# Patient Record
Sex: Female | Born: 2005 | Race: White | Hispanic: Yes | Marital: Single | Smoking: Never smoker
Health system: Southern US, Community
[De-identification: ages and names within clinical notes are randomized; demographics above are authoritative.]

## PROBLEM LIST (undated history)

## (undated) HISTORY — PX: NO PAST SURGERIES: SHX2092

---
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
## 2023-02-18 DIAGNOSIS — H5213 Myopia, bilateral: Secondary | ICD-10-CM | POA: Diagnosis not present

## 2023-03-13 DIAGNOSIS — H5213 Myopia, bilateral: Secondary | ICD-10-CM | POA: Diagnosis not present

## 2024-11-01 ENCOUNTER — Ambulatory Visit

## 2024-11-03 ENCOUNTER — Other Ambulatory Visit

## 2024-11-03 ENCOUNTER — Encounter: Payer: Self-pay | Admitting: Family

## 2024-11-03 ENCOUNTER — Ambulatory Visit: Admitting: Family

## 2024-11-03 ENCOUNTER — Other Ambulatory Visit (HOSPITAL_COMMUNITY)
Admission: RE | Admit: 2024-11-03 | Discharge: 2024-11-03 | Disposition: A | Source: Ambulatory Visit | Attending: Family | Admitting: Family

## 2024-11-03 VITALS — BP 138/87 | HR 78 | Ht 64.96 in | Wt 253.0 lb

## 2024-11-03 DIAGNOSIS — Z113 Encounter for screening for infections with a predominantly sexual mode of transmission: Secondary | ICD-10-CM

## 2024-11-03 DIAGNOSIS — E66813 Obesity, class 3: Secondary | ICD-10-CM

## 2024-11-03 DIAGNOSIS — L680 Hirsutism: Secondary | ICD-10-CM

## 2024-11-03 DIAGNOSIS — E559 Vitamin D deficiency, unspecified: Secondary | ICD-10-CM | POA: Diagnosis not present

## 2024-11-03 DIAGNOSIS — Z3202 Encounter for pregnancy test, result negative: Secondary | ICD-10-CM

## 2024-11-03 DIAGNOSIS — Z6841 Body Mass Index (BMI) 40.0 and over, adult: Secondary | ICD-10-CM | POA: Diagnosis not present

## 2024-11-03 DIAGNOSIS — N926 Irregular menstruation, unspecified: Secondary | ICD-10-CM | POA: Diagnosis not present
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
## 2024-11-03 NOTE — Patient Instructions (Addendum)
 It was nice to see you today! We will have a video visit in 3 weeks to review blood work results and discuss next steps.

## 2024-11-04 ENCOUNTER — Encounter: Payer: Self-pay | Admitting: Family

## 2024-11-07 ENCOUNTER — Ambulatory Visit: Payer: Self-pay | Admitting: Family

## 2024-11-07 LAB — URINE CYTOLOGY ANCILLARY ONLY
Chlamydia: NEGATIVE
Comment: NEGATIVE
Comment: NEGATIVE
Comment: NORMAL
Neisseria Gonorrhea: NEGATIVE
Trichomonas: NEGATIVE

## 2024-11-09 LAB — COMPREHENSIVE METABOLIC PANEL WITH GFR
AG Ratio: 1.8 (calc) (ref 1.0–2.5)
ALT: 29 U/L (ref 6–29)
AST: 17 U/L (ref 10–30)
Albumin: 4.9 g/dL (ref 3.6–5.1)
Alkaline phosphatase (APISO): 75 U/L (ref 31–125)
BUN: 12 mg/dL (ref 7–25)
CO2: 28 mmol/L (ref 20–32)
Calcium: 9.6 mg/dL (ref 8.6–10.2)
Chloride: 102 mmol/L (ref 98–110)
Creat: 0.61 mg/dL (ref 0.50–0.96)
Globulin: 2.8 g/dL (ref 1.9–3.7)
Glucose, Bld: 91 mg/dL (ref 65–99)
Potassium: 4.3 mmol/L (ref 3.5–5.3)
Sodium: 138 mmol/L (ref 135–146)
Total Bilirubin: 0.4 mg/dL (ref 0.2–1.2)
Total Protein: 7.7 g/dL (ref 6.1–8.1)
eGFR: 131 mL/min/1.73m2

## 2024-11-09 LAB — CBC WITH DIFFERENTIAL/PLATELET
Absolute Lymphocytes: 2849 {cells}/uL (ref 850–3900)
Absolute Monocytes: 716 {cells}/uL (ref 200–950)
Basophils Absolute: 54 {cells}/uL (ref 0–200)
Basophils Relative: 0.4 %
Eosinophils Absolute: 513 {cells}/uL — ABNORMAL HIGH (ref 15–500)
Eosinophils Relative: 3.8 %
HCT: 44.7 % (ref 35.9–46.0)
Hemoglobin: 14.8 g/dL (ref 11.7–15.5)
MCH: 28.7 pg (ref 27.0–33.0)
MCHC: 33.1 g/dL (ref 31.6–35.4)
MCV: 86.6 fL (ref 81.4–101.7)
MPV: 10.1 fL (ref 7.5–12.5)
Monocytes Relative: 5.3 %
Neutro Abs: 9369 {cells}/uL — ABNORMAL HIGH (ref 1500–7800)
Neutrophils Relative %: 69.4 %
Platelets: 435 Thousand/uL — ABNORMAL HIGH (ref 140–400)
RBC: 5.16 Million/uL — ABNORMAL HIGH (ref 3.80–5.10)
RDW: 12.2 % (ref 11.0–15.0)
Total Lymphocyte: 21.1 %
WBC: 13.5 Thousand/uL — ABNORMAL HIGH (ref 3.8–10.8)

## 2024-11-09 LAB — LIPID PANEL
Cholesterol: 181 mg/dL
HDL: 44 mg/dL — ABNORMAL LOW
LDL Cholesterol (Calc): 105 mg/dL — ABNORMAL HIGH
Non-HDL Cholesterol (Calc): 137 mg/dL — ABNORMAL HIGH
Total CHOL/HDL Ratio: 4.1 (calc)
Triglycerides: 204 mg/dL — ABNORMAL HIGH

## 2024-11-09 LAB — HEMOGLOBIN A1C
Hgb A1c MFr Bld: 5.4 %
Mean Plasma Glucose: 108 mg/dL
eAG (mmol/L): 6 mmol/L

## 2024-11-09 LAB — TESTOS,TOTAL,FREE AND SHBG (FEMALE)
Free Testosterone: 9.3 pg/mL — ABNORMAL HIGH (ref 0.1–6.4)
Sex Hormone Binding: 10 nmol/L — ABNORMAL LOW (ref 17–124)
Testosterone, Total, LC-MS-MS: 39 ng/dL (ref 2–45)

## 2024-11-09 LAB — FOLLICLE STIMULATING HORMONE: FSH: 6 m[IU]/mL

## 2024-11-09 LAB — VITAMIN D 25 HYDROXY (VIT D DEFICIENCY, FRACTURES): Vit D, 25-Hydroxy: 29 ng/mL — ABNORMAL LOW (ref 30–100)

## 2024-11-09 LAB — LUTEINIZING HORMONE: LH: 13.3 m[IU]/mL

## 2024-11-09 LAB — TSH+FREE T4: TSH W/REFLEX TO FT4: 1.34 m[IU]/L

## 2024-11-09 LAB — PROLACTIN: Prolactin: 7.8 ng/mL

## 2024-11-09 LAB — DHEA-SULFATE: DHEA-SO4: 204 ug/dL (ref 44–286)

## 2024-11-18 ENCOUNTER — Ambulatory Visit: Admitting: Pediatrics

## 2024-11-18 ENCOUNTER — Encounter: Payer: Self-pay | Admitting: Pediatrics

## 2024-11-18 ENCOUNTER — Ambulatory Visit
Admission: RE | Admit: 2024-11-18 | Discharge: 2024-11-18 | Disposition: A | Source: Ambulatory Visit | Attending: Pediatrics

## 2024-11-18 VITALS — HR 121 | Temp 99.4°F | Wt 255.2 lb

## 2024-11-18 DIAGNOSIS — J9801 Acute bronchospasm: Secondary | ICD-10-CM

## 2024-11-18 DIAGNOSIS — B349 Viral infection, unspecified: Secondary | ICD-10-CM

## 2024-11-18 DIAGNOSIS — R0603 Acute respiratory distress: Secondary | ICD-10-CM

## 2024-11-18 MED ORDER — ALBUTEROL SULFATE HFA 108 (90 BASE) MCG/ACT IN AERS
4.0000 | INHALATION_SPRAY | Freq: Once | RESPIRATORY_TRACT | Status: AC
  Administered 2024-11-18: 4 via RESPIRATORY_TRACT

## 2024-11-18 MED ORDER — ALBUTEROL SULFATE HFA 108 (90 BASE) MCG/ACT IN AERS: 2.0000 | INHALATION_SPRAY | RESPIRATORY_TRACT | 0 refills | Status: AC | PRN

## 2024-11-18 MED ORDER — DEXAMETHASONE 10 MG/ML FOR PEDIATRIC ORAL USE
16.0000 mg | Freq: Once | INTRAMUSCULAR | Status: AC
  Administered 2024-11-18: 16 mg via ORAL

## 2024-11-18 MED ORDER — PREDNISONE 20 MG PO TABS: 60.0000 mg | ORAL_TABLET | Freq: Every day | ORAL | 0 refills | Status: AC

## 2024-11-18 MED ORDER — ALBUTEROL SULFATE HFA 108 (90 BASE) MCG/ACT IN AERS: 4.0000 | INHALATION_SPRAY | RESPIRATORY_TRACT | 0 refills | Status: DC | PRN
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
## 2024-11-18 NOTE — Progress Notes (Signed)
 " Subjective:    Molly Powers is a 19 y.o. old female here with her mother for Cough (Sick since December , went away and came back.) and Shortness of Breath .    Interpreter present: none needed   HPI  Patient presents with respiratory symptoms that began in December with a sore throat, progressed to coughing, and has now returned with worsening symptoms including chest pain with coughing and wheezing.  She reports that when she coughs, it hurts her chest, and she experiences wheezing when breathing. She has never had wheezing before, never had to use albuterol .  She experiences shortness of breath when talking or trying to walk faster, feeling like she runs out of air. She had shortness of breath last winter, but reports this episode is worse. The coughing is worse at night. She denies ever having pneumonia before. She does not have an inhaler currently. She reports no fever.  The patient lives with family including older and younger sisters. There is no smoking in the home.  The patient's family history includes older sister with history of wheezing that has resolved, and younger sister with history of severe wheezing that has resolved.  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  Molly Powers  has no past medical history on file.       Objective:    Pulse (!) 121   Temp 99.4 F (37.4 C) (Tympanic)   Wt 255 lb 3.2 oz (115.8 kg)   SpO2 94%   BMI 42.52 kg/m   Physical Exam Constitutional:      General:  She is in acute distress.     Appearance: She is well-developed. She is not ill-appearing.  HENT:     Head: Normocephalic.     Mouth/Throat:     Mouth: Mucous membranes are moist.  Eyes:     Pupils: Pupils are equal, round, and reactive to light.  Cardiovascular:     Rate and Rhythm: Regular rhythm. Tachycardia present.     Heart sounds: No murmur heard. Pulmonary:     Effort: Tachypnea, accessory muscle usage and respiratory distress present.     Breath sounds: Wheezing present. No decreased breath sounds, rhonchi or rales.  Musculoskeletal:     Cervical back: Normal range of motion and neck supple.  Neurological:     Mental Status: She is alert.  Psychiatric:        Mood and Affect: Mood is anxious.           Assessment and Plan:     [name] was seen today for Cough (Sick since December , went away and came back.) and Shortness of Breath .   Problem List Items Addressed This Visit   None Visit Diagnoses       Bronchospasm, acute    -  Primary   Relevant Medications   albuterol  (VENTOLIN  HFA) 108 (90 Base) MCG/ACT inhaler 4 puff (Completed)   dexamethasone  (DECADRON ) 10 MG/ML injection for Pediatric ORAL use 16 mg (Completed)   albuterol  (VENTOLIN  HFA) 108 (90 Base) MCG/ACT inhaler  Other Relevant Orders   DG Chest 2 View (Completed)     Respiratory distress determined by examination       Relevant Medications   albuterol  (VENTOLIN  HFA) 108 (90 Base) MCG/ACT inhaler 4 puff (Completed)     Acute bronchospasm due to viral infection       Relevant Medications   albuterol  (VENTOLIN  HFA) 108 (90 Base) MCG/ACT inhaler 4 puff (Completed)   dexamethasone  (DECADRON ) 10 MG/ML injection for Pediatric ORAL use 16 mg (Completed)   albuterol  (VENTOLIN  HFA) 108 (90 Base) MCG/ACT inhaler   predniSONE  (DELTASONE ) 20 MG tablet   Other Relevant Orders   DG Chest 2 View (Completed)       1. Bronchospasm, acute (Primary) - 19 year old female presenting with first-time significant  wheezing episode following viral upper respiratory infection in December - Progressive symptoms from sore throat to cough, now with chest pain, wheezing, and shortness of breath that worsens at night and with exertion - Physical examination revealed significant wheezing bilaterally with oxygen saturation of 94% initially, progressing to 92% after albuterol  treatment. Likely drop in oxygen saturation from bronchodilation with medication. Patient felt much better after treatment, less dyspnea, able to speak full sentences.   We administered decadron  by mouth in office but patient threw up the decadron  from the smell immediately afterwards.  Plan - Prescribe albuterol  inhaler: 2 puffs every 4 hours with spacer.  - Prescribe prednisone : 60 mg daily for 3 days - Chest X-ray at [facility] to evaluate first time wheeze.  - Patient educated on proper inhaler technique with spacer - If symptoms worsen or patient requires more than 2 puffs of albuterol , escalate to urgent care or emergency department - If extreme shortness of breath occurs, take 4 puffs of albuterol  and go to emergency department immediately - Return to clinic tomorrow morning for recheck of wheezing  2. Respiratory distress determined by examination  - albuterol  (VENTOLIN  HFA) 108 (90 Base) MCG/ACT inhaler 4 puff  3. Acute bronchospasm due to viral infection  - albuterol  (VENTOLIN  HFA) 108 (90 Base) MCG/ACT inhaler 4 puff - dexamethasone  (DECADRON ) 10 MG/ML injection for Pediatric ORAL use 16 mg - albuterol  (VENTOLIN  HFA) 108 (90 Base) MCG/ACT inhaler; Inhale 2 puffs into the lungs every 4 (four) hours as needed for wheezing (or cough).  Dispense: 1 each; Refill: 0 - DG Chest 2 View; Future - predniSONE  (DELTASONE ) 20 MG tablet; Take 3 tablets (60 mg total) by mouth daily with breakfast for 3 days.  Dispense: 9 tablet; Refill: 0  Discussion around transferring patient to emergency room however given significant improvement  with in-office albuterol , will allow patient to be discharged to home.   Return in about 1 day (around 11/19/2024) for recheck wheezing.  Deland FORBES Halls, MD        "

## 2024-11-19 ENCOUNTER — Encounter: Payer: Self-pay | Admitting: Pediatrics

## 2024-11-19 ENCOUNTER — Ambulatory Visit: Admitting: Pediatrics

## 2024-11-19 VITALS — HR 66 | Wt 256.4 lb

## 2024-11-19 DIAGNOSIS — B349 Viral infection, unspecified: Secondary | ICD-10-CM

## 2024-11-19 DIAGNOSIS — J9801 Acute bronchospasm: Secondary | ICD-10-CM | POA: Diagnosis not present

## 2024-11-19 DIAGNOSIS — Z09 Encounter for follow-up examination after completed treatment for conditions other than malignant neoplasm: Secondary | ICD-10-CM

## 2024-11-19 NOTE — Progress Notes (Signed)
 " Subjective:    Molly Powers is a 19 y.o. old female here for Follow-up .    Interpreter present: None   HPI  Patient returns for follow-up after starting treatment with albuterol  inhaler and prednisone . The patient reports significant improvement in his cough, stating she only coughed twice last night and was able to sleep through the night without coughing. Her last albuterol  use was at 8 a.m. before today's visit. He denies fever and reports his cough has gotten better overnight. The patient has been using inhaler every 4 hours as prescribed and took 3 prednisone  pills yesterday.    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  Rilla  has no past medical history on file.       Objective:    Pulse 66   Wt 256 lb 6 oz (116.3 kg)   SpO2 97%   BMI 42.71 kg/m   Physical Exam Constitutional:      General: She is not in acute distress.    Appearance: Normal appearance. She is not ill-appearing.  HENT:     Head: Normocephalic.     Nose: Nose normal. No congestion.  Eyes:     Conjunctiva/sclera: Conjunctivae normal.  Cardiovascular:     Rate and Rhythm: Normal rate and regular rhythm.     Heart sounds: No murmur heard. Pulmonary:     Effort: Pulmonary effort is normal. No respiratory distress.     Breath sounds: Wheezing present.     Comments: Scant wheeze heard in the lower right lung field. Good clear aeration throughout.  Musculoskeletal:     Cervical back: Normal range of motion.   Neurological:     Mental Status: She is alert.     - Chest X-ray (yesterday): No pleural effusion, no focal opacities, no pneumothorax, normal appearance    Assessment and Plan:     Ivianna was seen today for Follow-up .   Problem List Items Addressed This Visit   None Visit Diagnoses       Follow-up exam    -  Primary     Acute bronchospasm due to viral infection           Patient presents with recent onset of cough and shortness of breath that has been responding well to bronchodilator therapy. Chest X-ray from yesterday showed no pleural effusion, no focal opacities, and no pneumothorax, ruling out pneumonia. Patient reports significant improvement in nocturnal cough, only coughing twice last night and sleeping through the night without interruption. Family history of asthma is present, suggesting possible development of asthma. Symptoms may have been triggered by viral infection or environmental allergen exposure. Current treatment with albuterol  inhaler and prednisone  appears effective. Plan: - Continue albuterol  inhaler every 4 hours through the weekend - Complete remaining 2 days of prednisone  (today and tomorrow) - Patient education provided regarding appropriate albuterol  use during sick episodes only - Advised that if using albuterol  more than 5-7 times per week due to symptoms, therapy should be stepped up - Return to emergency room if experiencing shortness  of breath where unable to complete a sentence - If not feeling totally better after weekend treatment, contact clinic  Video appointment scheduled with Bari Molt on the 28th (4 days from current visit)   No follow-ups on file.  Deland FORBES Halls, MD        "

## 2024-11-23 ENCOUNTER — Telehealth: Admitting: Family

## 2024-11-23 ENCOUNTER — Encounter: Payer: Self-pay | Admitting: Family

## 2024-11-23 DIAGNOSIS — R7989 Other specified abnormal findings of blood chemistry: Secondary | ICD-10-CM | POA: Diagnosis not present

## 2024-11-23 DIAGNOSIS — Z6841 Body Mass Index (BMI) 40.0 and over, adult: Secondary | ICD-10-CM | POA: Diagnosis not present

## 2024-11-23 DIAGNOSIS — E66813 Obesity, class 3: Secondary | ICD-10-CM

## 2024-11-23 DIAGNOSIS — N926 Irregular menstruation, unspecified: Secondary | ICD-10-CM | POA: Diagnosis not present

## 2024-11-23 NOTE — Progress Notes (Signed)
 THIS RECORD MAY CONTAIN CONFIDENTIAL INFORMATION THAT SHOULD NOT BE RELEASED WITHOUT REVIEW OF THE SERVICE PROVIDER.  Virtual Follow-Up Visit via Video Note  I connected with Molly Powers  on 11/23/24 at  3:00 PM EST by a video enabled telemedicine application and verified that I am speaking with the correct person using two identifiers.   Patient/parent location: [place], apartment  Provider location: remote, [place]   I discussed the limitations of evaluation and management by telemedicine and the availability of in person appointments.  I discussed that the purpose of this telehealth visit is to provide medical care.  The patient expressed understanding and agreed to proceed.   Molly Powers is a 19 y.o. female referred by No ref. provider found here today for follow-up of irregular periods.   History was provided by the patient.  Supervising Physician: Dr. Kreg Helena   PCP confirmed? Yes.   Helena Kreg, MD  Plan from Last Visit:   1. Class 3 severe obesity with body mass index (BMI) of 40.0 to 44.9 in adult, unspecified obesity type, unspecified whether serious comorbidity present (HCC) (Primary) 2. Irregular periods 3. Hirsutism   We discussed reasons for irregular cycles including H-P-O axis immaturity (not likely due to time from menarche), thyroid, pituitary, and other endocrine or hypothalamic dysfunctions, other causes of ovulatory dysfunction secondary to hyperandrogenism (+hirsutism), PCOS, and the possibility of structural or anatomical anomalies. Will obtain lab work today to rule in/rule out the above. Lab closed at this time; will return later this afternoon for lab collection.  Deferred GU exam today upon patient request; discussed that we would need to complete this before progesterone challenge in order to complete work-up and ensure no concerns for anatomical/structural obstruction of blood flow; patient agreeable to complete external  only exam at later follow-up. She returns to college tomorrow but returns home on weekends, and can return to clinic for in-person follow-up following a weekend at home; for now, we will have next visit virtually in one month to review labs and confirm next steps.    - DHEA-sulfate - Follicle stimulating hormone - Luteinizing hormone - Prolactin - Testos,Total,Free and SHBG (Female) - TSH + free T4 - Lipid panel - CBC with Differential/Platelet - Comprehensive metabolic panel with GFR - Hemoglobin A1c   4. Vitamin D  deficiency - VITAMIN D  25 Hydroxy (Vit-D Deficiency, Fractures)   5. Routine screening for STI (sexually transmitted infection) - Urine cytology ancillary only   6. Pregnancy examination or test, negative result - POCT urine pregnancy    Pertinent Labs:  Testosterone: Total 39, free 9.3, SHBG 10  TSH/Free T4/prolactin WNL  LH 13.3, FSH 6.0 (elevated ratio)  DHEAS WNL  A1c 5.4 Lipid Panel: HDL 44, Tri 204, LDL 105 Vitamin D  29  CBCd (WBC 13.5)  CMP WNL   Chart/Growth Chart Review: BMI 42.71    Chief Complaint: Irregular periods  History of Present Illness:   -reviewed lab work (as above)  -no bleeding or cycle since last visit  -was acutely sick since that visit; symptoms have improved (cough, SOB, normal CXR; was treated with prednisone  and albuterol )  -she is most interested in considering a birth control pill to regulate her cycle.  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 1 each 0   ibuprofen  (ADVIL ) 600 MG tablet Take 1 tablet (600 mg total) by mouth every 8 (eight) hours as needed for headache. Take at start of headache 30 tablet 0   No current facility-administered medications on file prior to visit.    No Known Allergies  Physical Exam:   The following portions of the patient's history were reviewed and updated as appropriate: allergies, current medications, past family history, past medical history, past social history, past surgical history, and problem list.  Visual Observations/Objective:   General Appearance: Well nourished well developed, in no apparent distress.  Eyes: conjunctiva no swelling or erythema ENT/Mouth: No hoarseness, No cough for duration of visit.  Neck: Supple  Respiratory: Respiratory effort normal, normal rate, no retractions or distress.   Cardio: Appears well-perfused, noncyanotic Musculoskeletal: no obvious deformity Skin: visible skin without rashes, ecchymosis, erythema Neuro: Awake and oriented X 3,  Psych:  normal affect, Insight and Judgment appropriate.    Assessment/Plan: 1. Irregular periods (Primary) 2. Class 3 severe obesity with body mass index (BMI) of 40.0 to 44.9 in adult, unspecified obesity type, unspecified whether serious comorbidity present (HCC) 3. Elevated testosterone level in female  Reviewed lab work which is consistent with probable PCOS, explaining that PCOS is a metabolic condition with symptoms of menstrual irregularity, excess hair growth, acne and sometimes obesity. However, it is considered a diagnosis of exclusion meaning we have to ensure there are no other reasons for the symptoms. Thyroid labs, prolactin labs are normal.  Counseled patient about management of PCOS-like symptoms, including OCPs (birth control) is the mainstay of pharmacologic therapy for women with PCOS for managing hyperandrogenism and menstrual dysfunction  and for providing contraception. She is not currently sexually active but may benefit from the menstrual regulation with OCP use.  Did not address BMI and PCOS, however weight loss has been shown to help with restoring ovulatory cycles, decreasing glucose intolerance with improvement of metabolic risk, and improving fertility/pregnancy rates and helps with overall health. Her current BMI is 42.71%. Even modest weight loss (5 to 10 percent reduction in body weight) in women with PCOS may result in these effects. Consider nutrition referral and discuss lifestyle strategies at next follow-up.  Discussed that we will obtain a pelvic ultrasound to rule out anatomical or structural etiologies, and will also proceed with external only GU exam prior to initiation of COCs for menstrual regulation. Confirmed that she had one episode of light sensitivity with migraine headache in past, however does not endorse migraine with aura, no known liver disease (CMP WNL), and no history of childhood cancers.  Return to clinic for external GU exam and pelvic ultrasound.  - US  PELVIS (TRANSABDOMINAL ONLY)  I discussed the assessment and treatment plan with the patient and/or parent/guardian.  They were provided an opportunity to ask questions and all were answered.  They agreed with the plan and demonstrated an understanding of the instructions. They were advised to call back or seek an in-person evaluation in the emergency room if the symptoms worsen or if the condition fails to improve as anticipated.   Follow-up:   in person follow-up on a Monday morning  Bari CHRISTELLA Molt, NP    CC: Leta Crazier, MD, No ref. provider found

## 2024-11-28 ENCOUNTER — Encounter: Admitting: Family

## 2024-12-05 ENCOUNTER — Encounter: Admitting: Family

## 2024-12-13 ENCOUNTER — Other Ambulatory Visit
